# Patient Record
Sex: Male | Born: 1965 | Race: White | Hispanic: No | Marital: Married | Smoking: Never smoker
Health system: Southern US, Community
[De-identification: ages and names within clinical notes are randomized; demographics above are authoritative.]

## PROBLEM LIST (undated history)

## (undated) DIAGNOSIS — K219 Gastro-esophageal reflux disease without esophagitis: Secondary | ICD-10-CM

## (undated) HISTORY — PX: TONSILLECTOMY: SUR1361

---

## 2001-09-15 ENCOUNTER — Ambulatory Visit (HOSPITAL_COMMUNITY): Admission: RE | Admit: 2001-09-15 | Discharge: 2001-09-15 | Payer: Self-pay | Admitting: *Deleted

## 2016-09-20 ENCOUNTER — Encounter (HOSPITAL_BASED_OUTPATIENT_CLINIC_OR_DEPARTMENT_OTHER): Payer: Self-pay | Admitting: Emergency Medicine

## 2016-09-20 ENCOUNTER — Emergency Department (HOSPITAL_BASED_OUTPATIENT_CLINIC_OR_DEPARTMENT_OTHER): Payer: BLUE CROSS/BLUE SHIELD

## 2016-09-20 ENCOUNTER — Emergency Department (HOSPITAL_BASED_OUTPATIENT_CLINIC_OR_DEPARTMENT_OTHER)
Admission: EM | Admit: 2016-09-20 | Discharge: 2016-09-20 | Disposition: A | Discharge status: Home / Self Care | Payer: BLUE CROSS/BLUE SHIELD | Attending: Emergency Medicine | Admitting: Emergency Medicine

## 2016-09-20 DIAGNOSIS — M5412 Radiculopathy, cervical region: Secondary | ICD-10-CM

## 2016-09-20 DIAGNOSIS — M542 Cervicalgia: Secondary | ICD-10-CM | POA: Diagnosis present

## 2016-09-20 HISTORY — DX: Gastro-esophageal reflux disease without esophagitis: K21.9

## 2016-09-20 MED ORDER — KETOROLAC TROMETHAMINE 30 MG/ML IJ SOLN
30.0000 mg | Freq: Once | INTRAMUSCULAR | Status: AC
  Administered 2016-09-20: 30 mg via INTRAMUSCULAR
  Filled 2016-09-20: qty 1

## 2016-09-20 MED ORDER — HYDROCODONE-ACETAMINOPHEN 5-325 MG PO TABS: 1.0000 | ORAL_TABLET | Freq: Four times a day (QID) | ORAL | 0 refills | Status: DC | PRN

## 2016-09-20 MED ORDER — HYDROCODONE-ACETAMINOPHEN 5-325 MG PO TABS
1.0000 | ORAL_TABLET | Freq: Once | ORAL | Status: AC
  Administered 2016-09-20: 1 via ORAL
  Filled 2016-09-20: qty 1

## 2016-09-20 MED ORDER — METHYLPREDNISOLONE 4 MG PO TBPK: ORAL_TABLET | ORAL | 0 refills | Status: DC

## 2016-09-20 NOTE — Discharge Instructions (Signed)
He was seen today for pain going down her left arm. This is likely related to nerve impingement in your neck or shoulder. He will be given a Medrol Dosepak and something for breakthrough pain. Follow-up with sports medicine. You may need an MRI if symptoms persist. If you develop weakness or any new or worsening symptoms you need to be reevaluated immediately.

## 2016-09-20 NOTE — ED Triage Notes (Signed)
PT reports left shoulder pain that started 2 days ago without injury. Reports pain goes down arm to fingers and fingers are tingling.

## 2016-09-20 NOTE — ED Notes (Signed)
Pt states he works out with [employer] and feels he has somehow injured his left shoulder and neck. Hurts worse when he turns his neck a certain way. +grips bilat. +radial pulses palp. Cap refill < 3 sec.

## 2016-09-20 NOTE — ED Notes (Signed)
Pt discharged to home with family... nad.Marland Kitchen..Marland Kitchen

## 2016-09-20 NOTE — ED Provider Notes (Signed)
[facility]   CSN: [id number] Arrival date & time: 09/20/16  0051     History   Chief Complaint Chief Complaint  Patient presents with  . Shoulder Pain    HPI Micheal Tucker is a 51 y.o. male.  HPI  This is a 51 year old male who presents with left neck and arm pain. Patient reports 2 to three-day history of worsening pain that originates in his neck and radiates down his left arm. He reports that it radiates down the backside of his arm and he reports some tingling in his fourth and fifth digits of his left hand. Denies any injury. He does work out with [employer] and uses [employer] frequently.  Denies any weakness. No known injury. He has taken ibuprofen at home with some relief of his symptoms. Currently his pain is 7 out of 10.  Past Medical History:  Diagnosis Date  . GERD (gastroesophageal reflux disease)     There are no active problems to display for this patient.   Past Surgical History:  Procedure Laterality Date  . TONSILLECTOMY         Home Medications    Prior to Admission medications   Medication Sig Start Date End Date Taking? Authorizing Provider  HYDROcodone-acetaminophen (NORCO/VICODIN) 5-325 MG tablet Take 1 tablet by mouth every 6 (six) hours as needed. 09/20/16   Shon Baton, MD  methylPREDNISolone (MEDROL DOSEPAK) 4 MG TBPK tablet Take as directed on packet 09/20/16   Shon Baton, MD    Family History No family history on file.  Social History Social History  Substance Use Topics  . Smoking status: Never Smoker  . Smokeless tobacco: Never Used  . Alcohol use Yes     Allergies   Dexlansoprazole   Review of Systems Review of Systems  Respiratory: Negative for shortness of breath.   Cardiovascular: Negative for chest pain.  Musculoskeletal:       Left neck and arm pain  Neurological: Positive for numbness.  All other systems reviewed and are negative.    Physical Exam Updated Vital  Signs BP (!) 143/108 (BP Location: Left Arm)   Pulse 64   Temp 97.5 F (36.4 C) (Oral)   Resp 23   Ht 5\' 10"  (1.778 m)   Wt 190 lb (86.2 kg)   SpO2 100%   BMI 27.26 kg/m   Physical Exam  Constitutional: He is oriented to person, place, and time. He appears well-developed and well-nourished. No distress.  HENT:  Head: Normocephalic and atraumatic.  Neck: Normal range of motion. Neck supple.  No midline C-spine tenderness, tenderness to palpation of the musculature overlying the left brachial plexus, no overlying skin changes  Cardiovascular: Normal rate, regular rhythm and normal heart sounds.   No murmur heard. Pulmonary/Chest: Effort normal and breath sounds normal. No respiratory distress. He has no wheezes.  Musculoskeletal: He exhibits no edema or deformity.  Neurological: He is alert and oriented to person, place, and time.  5 out of 5 strength with grip, triceps, biceps, and deltoid strength of the left upper extremity, sensation grossly intact  Skin: Skin is warm and dry.  Psychiatric: He has a normal mood and affect.  Nursing note and vitals reviewed.    ED Treatments / Results  Labs (all labs ordered are listed, but only abnormal results are displayed) Labs Reviewed - No data to display  EKG  EKG Interpretation None       Radiology Dg Shoulder Left  Result Date: 09/20/2016 CLINICAL DATA:  Initial evaluation for acute shoulder pain, tingling and left upper extremity. No known injury. EXAM: LEFT SHOULDER - 2+ VIEW COMPARISON:  None. FINDINGS: There is no evidence of fracture or dislocation. There is no evidence of arthropathy or other focal bone abnormality. Soft tissues are unremarkable. IMPRESSION: No acute osseous abnormality about the left shoulder. Electronically Signed   By: Rise MuBenjamin  McClintock M.D.   On: 09/20/2016 01:19    Procedures Procedures (including critical care time)  Medications Ordered in ED Medications  ketorolac (TORADOL) 30 MG/ML  injection 30 mg (not administered)  HYDROcodone-acetaminophen (NORCO/VICODIN) 5-325 MG per tablet 1 tablet (not administered)     Initial Impression / Assessment and Plan / ED Course  I have reviewed the triage vital signs and the nursing notes.  Pertinent labs & imaging results that were available during my care of the patient were reviewed by me and considered in my medical decision making (see chart for details).     Patient presents with left arm and neck pain. History is classic for cervical radiculopathy likely in the C8 distribution given tingling in the fourth and fifth digits. No known injury but patient does use weights frequently. He is otherwise neurologically intact. Doubt x-rays would be helpful given no history suggestive of fracture. Will treat with Medrol Dosepak and Norco for breakthrough pain. Refer to sports medicine. Discussed with the patient that if symptoms persist or he develops weakness, he may need an MRI for definitive evaluation.  After history, exam, and medical workup I feel the patient has been appropriately medically screened and is safe for discharge home. Pertinent diagnoses were discussed with the patient. Patient was given return precautions.   Final Clinical Impressions(s) / ED Diagnoses   Final diagnoses:  Cervical radiculopathy at C8    New Prescriptions New Prescriptions   HYDROCODONE-ACETAMINOPHEN (NORCO/VICODIN) 5-325 MG TABLET    Take 1 tablet by mouth every 6 (six) hours as needed.   METHYLPREDNISOLONE (MEDROL DOSEPAK) 4 MG TBPK TABLET    Take as directed on packet     Shon Batonourtney F Rocket Gunderson, MD 09/20/16 0200

## 2016-09-20 NOTE — ED Notes (Signed)
Patient transported to X-ray 

## 2016-09-28 ENCOUNTER — Encounter: Payer: Self-pay | Admitting: Family Medicine

## 2016-09-28 ENCOUNTER — Ambulatory Visit (INDEPENDENT_AMBULATORY_CARE_PROVIDER_SITE_OTHER): Payer: BLUE CROSS/BLUE SHIELD | Admitting: Family Medicine

## 2016-09-28 DIAGNOSIS — M5010 Cervical disc disorder with radiculopathy, unspecified cervical region: Secondary | ICD-10-CM | POA: Diagnosis not present

## 2016-09-28 MED ORDER — OXYCODONE-ACETAMINOPHEN 5-325 MG PO TABS: 1.0000 | ORAL_TABLET | Freq: Four times a day (QID) | ORAL | 0 refills | Status: DC | PRN

## 2016-09-28 MED ORDER — METHOCARBAMOL 500 MG PO TABS: 500.0000 mg | ORAL_TABLET | Freq: Three times a day (TID) | ORAL | 1 refills | Status: AC | PRN

## 2016-09-28 MED ORDER — DICLOFENAC SODIUM 75 MG PO TBEC: 75.0000 mg | DELAYED_RELEASE_TABLET | Freq: Two times a day (BID) | ORAL | 1 refills | Status: AC

## 2016-09-28 NOTE — Patient Instructions (Signed)
You have cervical radiculopathy (a pinched nerve in the neck). Diclofenac 75mg  twice a day with food for pain and inflammation. Robaxin three times a day as needed for muscle spasms. Oxycodone as needed for severe pain (no driving on this medicine). We will go ahead with an MRI as next step Consider cervical collar if severely painful. Simple range of motion exercises within limits of pain to prevent further stiffness. Consider physical therapy for stretching, exercises, traction, and modalities. Heat 15 minutes at a time 3-4 times a day to help with spasms. Watch head position when on computers, texting, when sleeping in bed - should in line with back to prevent further nerve traction and irritation. Consider home traction unit if you get benefit with this in physical therapy. Follow up will depend on the MRI result.

## 2016-09-29 DIAGNOSIS — M5010 Cervical disc disorder with radiculopathy, unspecified cervical region: Secondary | ICD-10-CM | POA: Insufficient documentation

## 2016-09-29 NOTE — Progress Notes (Addendum)
PCP: Pcp Not In System  Subjective:   HPI: Patient is a 51 y.o. male here for neck pain.  Patient reports he's had 1 week of severe left sided neck pain radiating into left arm. Associated numbness into 1st-3rd fingers. Felt like it was going to explode pain was so bad on Sunday night. Pain level now 5/10, sharp. Feels stiff also. Tried medrol dose pack already and just made him shaky - no improvement. Left hand feels weak. Pain worse with turning head. No bowel/bladder dysfunction. No skin changes.  Past Medical History:  Diagnosis Date  . GERD (gastroesophageal reflux disease)     No current outpatient prescriptions on file prior to visit.   No current facility-administered medications on file prior to visit.     Past Surgical History:  Procedure Laterality Date  . TONSILLECTOMY      No Active Allergies  Social History   Social History  . Marital status: Married    Spouse name: N/A  . Number of children: N/A  . Years of education: N/A   Occupational History  . Not on file.   Social History Main Topics  . Smoking status: Never Smoker  . Smokeless tobacco: Never Used  . Alcohol use Yes  . Drug use: Unknown  . Sexual activity: Not on file   Other Topics Concern  . Not on file   Social History Narrative  . No narrative on file    No family history on file.  BP (!) 150/100   Pulse 67   Ht 5\' 11"  (1.803 m)   Wt 195 lb (88.5 kg)   BMI 27.20 kg/m   Review of Systems: See HPI above.     Objective:  Physical Exam:  Gen: NAD, comfortable in exam room  Neck: No gross deformity, swelling, bruising. TTP mild left cervical paraspinal region.  No midline/bony TTP. FROM neck - pain on left lateral rotation. 5-/5 strength with elbow extension, 5/5 other bilateral upper extremity muscle groups. Sensation diminished left 1st-3rd digits.  Normal rest of bilateral upper extremities. 2+ equal reflexes in triceps, biceps, brachioradialis tendons. Positive  spurlings on left, negative right.   Assessment & Plan:  1. Neck pain - 2/2 cervical radiculopathy.  Unfortunately with severe pain despite medrol dose pack.  Has some mild weakness, numbness into 1st-3rd digits also.  Given severity of pain, neurologic deficits will go ahead with MRI to assess for lower cervical radiculopathy.  Likely ESI vs neurosurgery referral depending on results.  In meantime will take diclofenac with robaxin and oxycodone as needed.  Ergonomic issues discussed.  Heat for spasms.  Addendum:  MRI reviewed and discussed with patient.  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  Discussed options - will go ahead with ESI, advised him to call us a week later to let us know how he's doing.

## 2016-09-29 NOTE — Assessment & Plan Note (Signed)
2/2 cervical radiculopathy.  Unfortunately with severe pain despite medrol dose pack.  Has some mild weakness, numbness into 1st-3rd digits also.  Given severity of pain, neurologic deficits will go ahead with MRI to assess for lower cervical radiculopathy.  Likely ESI vs neurosurgery referral depending on results.  In meantime will take diclofenac with robaxin and oxycodone as needed.  Ergonomic issues discussed.  Heat for spasms.

## 2016-09-30 NOTE — Addendum Note (Signed)
Addended by: Kathi SimpersWISE, Pattijo Juste F on: 09/30/2016 08:57 AM   Modules accepted: Orders

## 2016-10-04 ENCOUNTER — Other Ambulatory Visit: Payer: Self-pay | Admitting: *Deleted

## 2016-10-04 DIAGNOSIS — M5010 Cervical disc disorder with radiculopathy, unspecified cervical region: Secondary | ICD-10-CM

## 2016-10-05 ENCOUNTER — Telehealth: Payer: Self-pay | Admitting: Family Medicine

## 2016-10-05 NOTE — Telephone Encounter (Signed)
Please contact the pharmacy and ask if they still have the script for this and confirm he hasn't picked this up.  Oxycontin is the brand name for Oxycodone so they're essentially the same.  If he does not want to take that confirm he hasn't picked it up and we will do the tramadol instead.

## 2016-10-05 NOTE — Telephone Encounter (Signed)
Patient states he does not feel comfortable taking Oxycodone. Wants to know if another medication can be prescribed such as Tramadol

## 2016-10-05 NOTE — Telephone Encounter (Signed)
Spoke to patient and he thought that it was oxycotin instead of oxycodone-acetaminophen. Told him to check with his pharmacy about the two (since he had questions) and if he did not want to do the oxycodone, we would need to shred that script and do one for tramadol instead.

## 2016-10-05 NOTE — Telephone Encounter (Signed)
Did he already pick up the oxycodone prescription from the front desk?  If so we cannot do the tramadol also.  That would be three narcotic prescriptions within a month - even though I'm sure he's not taking them all we couldn't do that.  If he hasn't picked up the oxycodone we could shred the script and give tramadol instead.

## 2016-10-06 ENCOUNTER — Encounter: Payer: Self-pay | Admitting: Family Medicine

## 2016-10-06 MED ORDER — TRAMADOL HCL 50 MG PO TABS: 50.0000 mg | ORAL_TABLET | ORAL | 0 refills | Status: AC | PRN

## 2016-10-06 NOTE — Telephone Encounter (Signed)
Called pharmacy and asked to shred other script. Called patient and told him that script for Tramadol was ready to be picked up.

## 2016-10-06 NOTE — Telephone Encounter (Signed)
Medication has not been picked up per pharmacy. Spoke to patient and gave him information about Oxycodone and Oxycotin. Patient stated that he did not want to take that and would like to do Tramadol.

## 2016-10-06 NOTE — Telephone Encounter (Signed)
Noted, thanks.  Please ask them to shred script.  Tramadol script was printed.

## 2017-03-31 ENCOUNTER — Other Ambulatory Visit: Payer: Self-pay | Admitting: Family Medicine

## 2018-04-27 IMAGING — CR DG SHOULDER 2+V*L*
3 series · 3 of 3 positions shown · non-contrast
Comparison: None.

CLINICAL DATA: Initial evaluation for acute shoulder pain, tingling
and left upper extremity. No known injury.

EXAM:
LEFT SHOULDER - 2+ VIEW

[w shoulder grashey left]
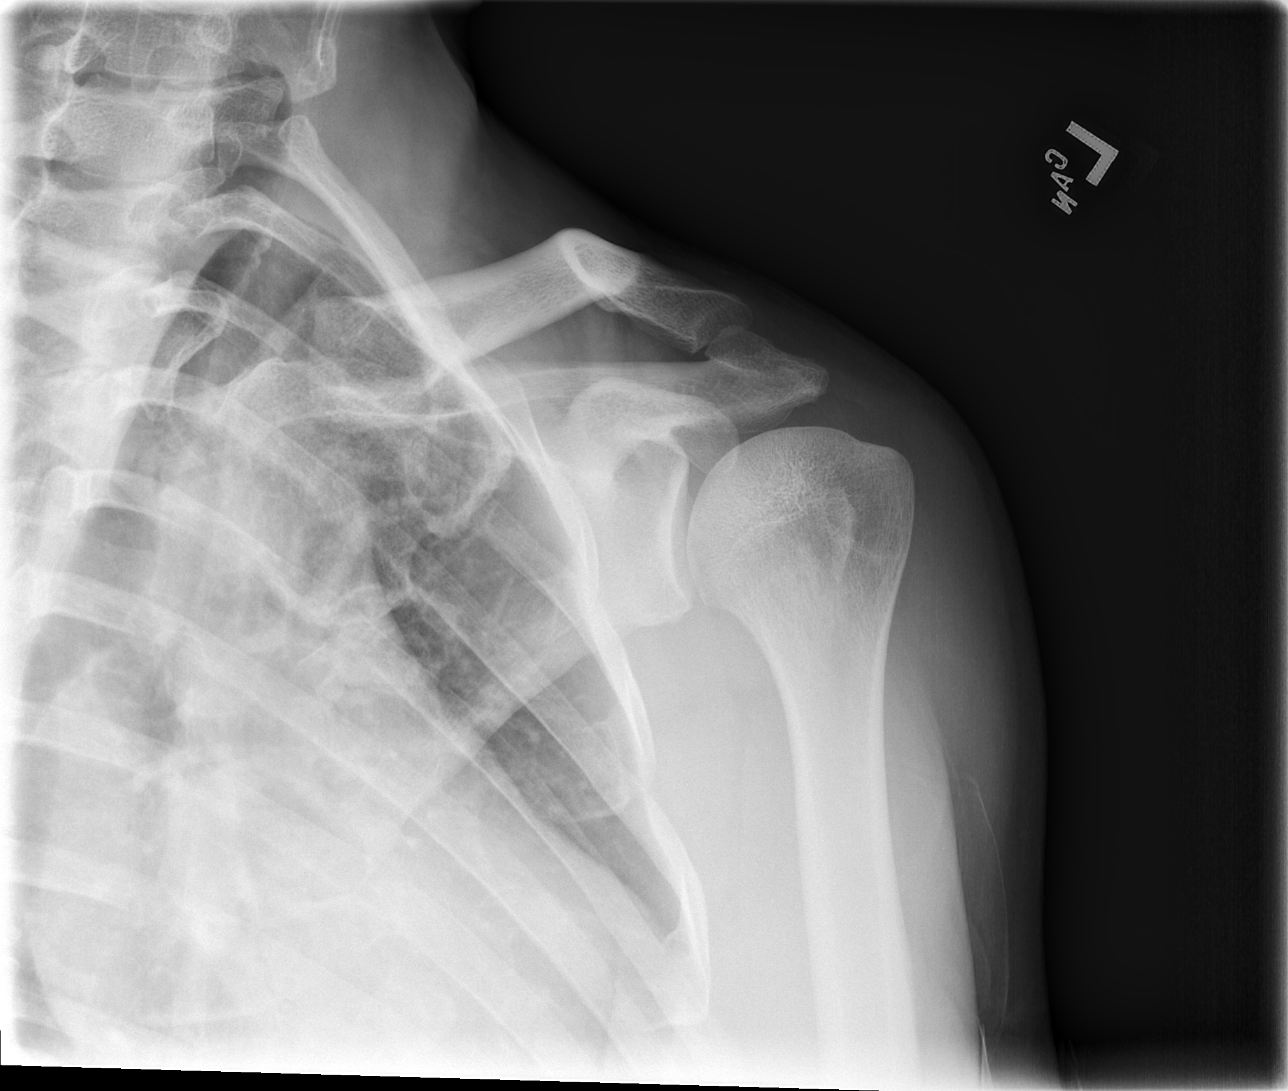

[w shoulder y view left]
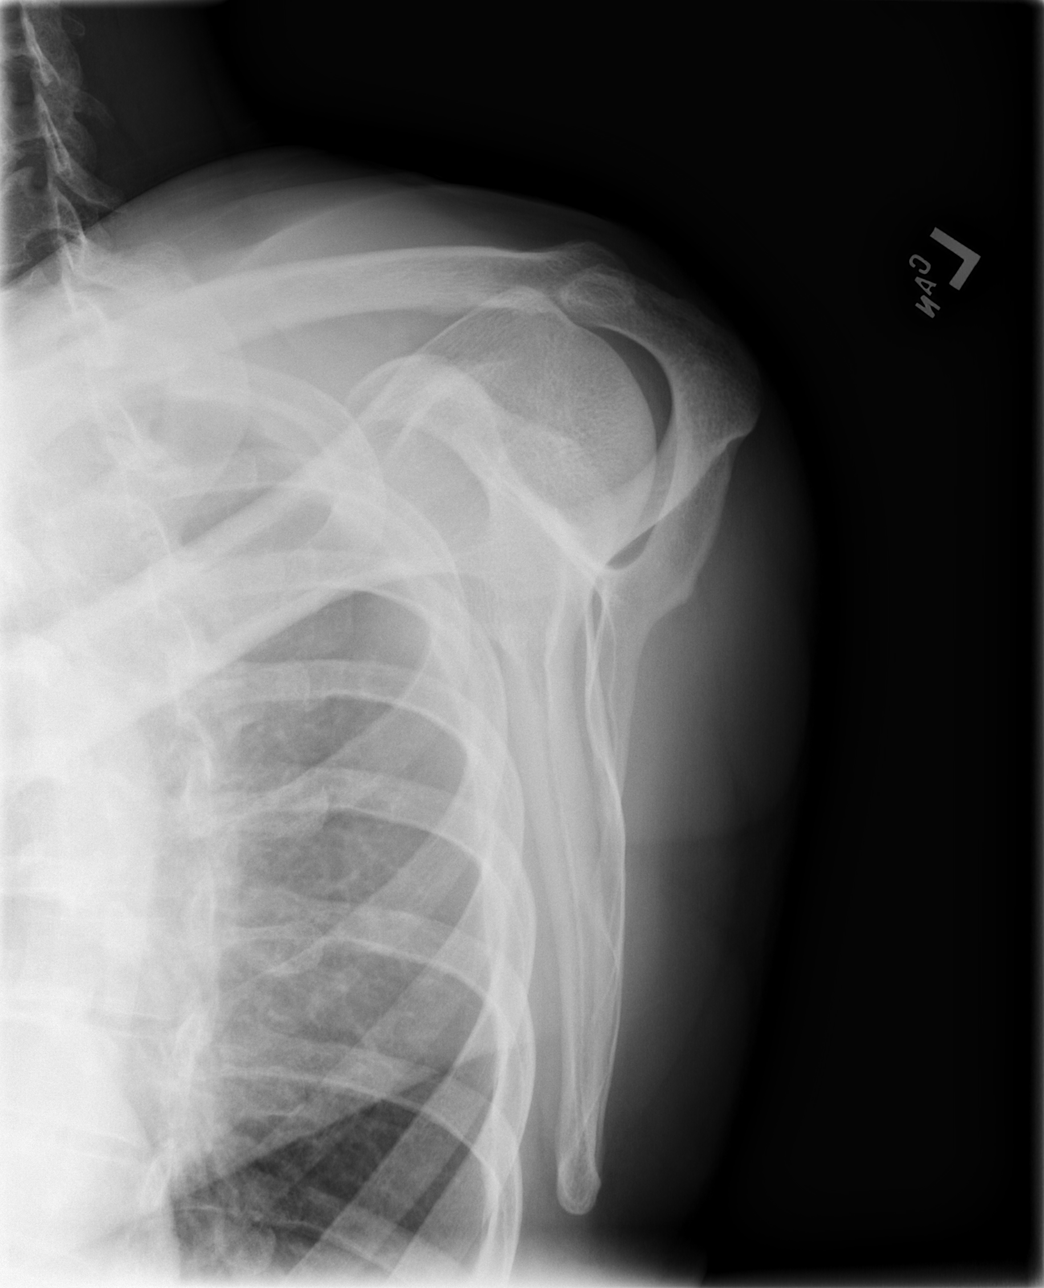

[x shoulder axillary left]
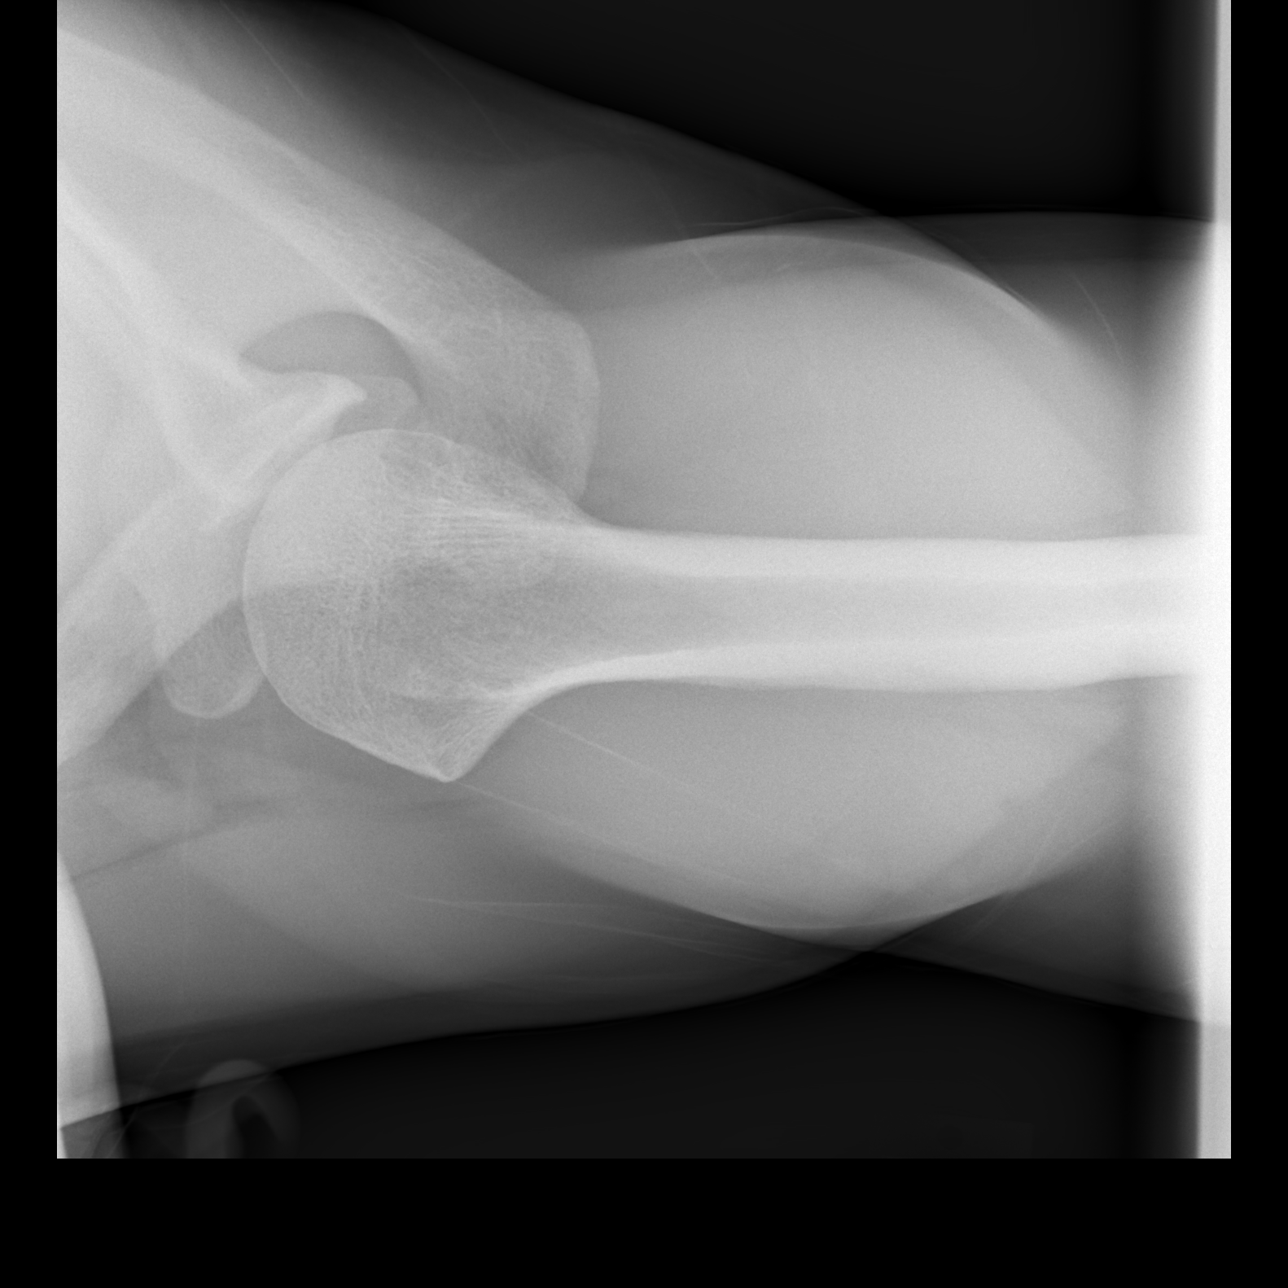

[3 of 3 positions shown; findings below may reference images not displayed]

FINDINGS: There is no evidence of fracture or dislocation. There is no
evidence of arthropathy or other focal bone abnormality. Soft
tissues are unremarkable.
IMPRESSION: No acute osseous abnormality about the left shoulder.
